# Patient Record
Sex: Male | Born: 1985 | Race: White | Hispanic: No | Marital: Single
Health system: Southern US, Community
[De-identification: ages and names within clinical notes are randomized; demographics above are authoritative.]

## PROBLEM LIST (undated history)

## (undated) DIAGNOSIS — M549 Dorsalgia, unspecified: Secondary | ICD-10-CM

---
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
## 2006-07-25 ENCOUNTER — Ambulatory Visit: Payer: Self-pay | Admitting: Physician Assistant

## 2017-05-10 ENCOUNTER — Encounter (HOSPITAL_COMMUNITY): Payer: Self-pay | Admitting: Emergency Medicine

## 2017-05-10 ENCOUNTER — Emergency Department (HOSPITAL_COMMUNITY): Payer: BLUE CROSS/BLUE SHIELD

## 2017-05-10 ENCOUNTER — Inpatient Hospital Stay (HOSPITAL_COMMUNITY)
Admission: EM | Admit: 2017-05-10 | Discharge: 2017-05-12 | DRG: 918 | Disposition: A | Discharge status: Home / Self Care | Payer: BLUE CROSS/BLUE SHIELD | Attending: Internal Medicine | Admitting: Internal Medicine

## 2017-05-10 DIAGNOSIS — R34 Anuria and oliguria: Secondary | ICD-10-CM | POA: Diagnosis present

## 2017-05-10 DIAGNOSIS — F1910 Other psychoactive substance abuse, uncomplicated: Secondary | ICD-10-CM | POA: Diagnosis present

## 2017-05-10 DIAGNOSIS — T40602A Poisoning by unspecified narcotics, intentional self-harm, initial encounter: Secondary | ICD-10-CM

## 2017-05-10 DIAGNOSIS — T403X1A Poisoning by methadone, accidental (unintentional), initial encounter: Secondary | ICD-10-CM | POA: Diagnosis not present

## 2017-05-10 DIAGNOSIS — G8929 Other chronic pain: Secondary | ICD-10-CM | POA: Diagnosis present

## 2017-05-10 DIAGNOSIS — M545 Low back pain: Secondary | ICD-10-CM | POA: Diagnosis present

## 2017-05-10 DIAGNOSIS — T424X1A Poisoning by benzodiazepines, accidental (unintentional), initial encounter: Secondary | ICD-10-CM | POA: Diagnosis present

## 2017-05-10 DIAGNOSIS — F1114 Opioid abuse with opioid-induced mood disorder: Secondary | ICD-10-CM | POA: Diagnosis present

## 2017-05-10 DIAGNOSIS — J9601 Acute respiratory failure with hypoxia: Secondary | ICD-10-CM

## 2017-05-10 DIAGNOSIS — R410 Disorientation, unspecified: Secondary | ICD-10-CM | POA: Diagnosis not present

## 2017-05-10 HISTORY — DX: Dorsalgia, unspecified: M54.9

## 2017-05-10 LAB — CBC WITH DIFFERENTIAL/PLATELET
Basophils Absolute: 0 10*3/uL (ref 0.0–0.1)
Basophils Relative: 0 %
Eosinophils Absolute: 0.4 10*3/uL (ref 0.0–0.7)
Eosinophils Relative: 3 %
HEMATOCRIT: 38.6 % — AB (ref 39.0–52.0)
Hemoglobin: 13.1 g/dL (ref 13.0–17.0)
LYMPHS ABS: 1.5 10*3/uL (ref 0.7–4.0)
LYMPHS PCT: 11 %
MCH: 29.1 pg (ref 26.0–34.0)
MCHC: 33.9 g/dL (ref 30.0–36.0)
MCV: 85.8 fL (ref 78.0–100.0)
MONO ABS: 0.8 10*3/uL (ref 0.1–1.0)
MONOS PCT: 6 %
NEUTROS ABS: 10.7 10*3/uL — AB (ref 1.7–7.7)
Neutrophils Relative %: 80 %
Platelets: 197 10*3/uL (ref 150–400)
RBC: 4.5 MIL/uL (ref 4.22–5.81)
RDW: 13.3 % (ref 11.5–15.5)
WBC: 13.4 10*3/uL — AB (ref 4.0–10.5)

## 2017-05-10 LAB — COMPREHENSIVE METABOLIC PANEL
ALBUMIN: 3.9 g/dL (ref 3.5–5.0)
ALK PHOS: 50 U/L (ref 38–126)
ALT: 17 U/L (ref 17–63)
ANION GAP: 7 (ref 5–15)
AST: 23 U/L (ref 15–41)
BILIRUBIN TOTAL: 0.6 mg/dL (ref 0.3–1.2)
BUN: 5 mg/dL — ABNORMAL LOW (ref 6–20)
CO2: 27 mmol/L (ref 22–32)
Calcium: 8.2 mg/dL — ABNORMAL LOW (ref 8.9–10.3)
Chloride: 105 mmol/L (ref 101–111)
Creatinine, Ser: 1.05 mg/dL (ref 0.61–1.24)
GFR calc Af Amer: >60 mL/min (ref >60)
GFR calc non Af Amer: >60 mL/min (ref >60)
Glucose, Bld: 98 mg/dL (ref 65–99)
Potassium: 3.7 mmol/L (ref 3.5–5.1)
SODIUM: 139 mmol/L (ref 135–145)
Total Protein: 6.1 g/dL — ABNORMAL LOW (ref 6.5–8.1)

## 2017-05-10 LAB — SALICYLATE LEVEL: Salicylate Lvl: <7 mg/dL (ref 2.8–30.0)

## 2017-05-10 LAB — I-STAT CHEM 8, ED
BUN: 7 mg/dL (ref 6–20)
CALCIUM ION: 1.12 mmol/L — AB (ref 1.15–1.40)
CHLORIDE: 99 mmol/L — AB (ref 101–111)
Creatinine, Ser: 1 mg/dL (ref 0.61–1.24)
Glucose, Bld: 96 mg/dL (ref 65–99)
HEMATOCRIT: 38 % — AB (ref 39.0–52.0)
Hemoglobin: 12.9 g/dL — ABNORMAL LOW (ref 13.0–17.0)
POTASSIUM: 3.4 mmol/L — AB (ref 3.5–5.1)
SODIUM: 139 mmol/L (ref 135–145)
TCO2: 29 mmol/L (ref 0–100)

## 2017-05-10 LAB — ETHANOL: ALCOHOL ETHYL (B): 14 mg/dL — AB (ref <5)

## 2017-05-10 LAB — ACETAMINOPHEN LEVEL: Acetaminophen (Tylenol), Serum: <10 ug/mL — ABNORMAL LOW (ref 10–30)

## 2017-05-10 MED ORDER — SODIUM CHLORIDE 0.9 % IV BOLUS (SEPSIS)
1000.0000 mL | Freq: Once | INTRAVENOUS | Status: AC
  Administered 2017-05-10: 1000 mL via INTRAVENOUS

## 2017-05-10 MED ORDER — NALOXONE HCL 2 MG/2ML IJ SOSY
0.5000 mg/h | PREFILLED_SYRINGE | INTRAVENOUS | Status: DC
  Administered 2017-05-11 (×2): 0.5 mg/h via INTRAVENOUS
  Filled 2017-05-10: qty 4

## 2017-05-10 MED ORDER — NALOXONE HCL 2 MG/2ML IJ SOSY
0.2500 mg/h | PREFILLED_SYRINGE | INTRAVENOUS | Status: DC
  Filled 2017-05-10: qty 4

## 2017-05-10 MED ORDER — NALOXONE HCL 0.4 MG/ML IJ SOLN
0.4000 mg | Freq: Once | INTRAMUSCULAR | Status: AC
  Administered 2017-05-10: 0.4 mg via INTRAVENOUS
  Filled 2017-05-10: qty 1

## 2017-05-10 NOTE — ED Triage Notes (Signed)
PT BIB [facility] for possible over dose. Per EMS 15-20 minutes of Bystander CPR. PT was ambulatory and confused on EMS arrival. Pt has been given 0.4 narcan and 800cc NS. Pt took herion possibly

## 2017-05-10 NOTE — ED Notes (Signed)
Pt states he took heroin today.  Hasn't used in a few days. Uses meth every day.  Pt only complaint at this time is of soreness in his chest and back.

## 2017-05-10 NOTE — ED Notes (Signed)
Called to follow up on Narcan drip

## 2017-05-10 NOTE — ED Provider Notes (Signed)
[facility]ENCY DEPT Provider Note   CSN: [id number] Arrival date & time: 05/10/17  1947     History   Chief Complaint Chief Complaint  Patient presents with  . Drug Overdose    HPI Denvil Canning is a 31 y.o. male.   Drug Overdose  This is a new problem. The current episode started 1 to 2 hours ago. The problem occurs constantly. The problem has not changed since onset.Associated symptoms include chest pain. Nothing aggravates the symptoms. Nothing relieves the symptoms. He has tried nothing for the symptoms.    Past Medical History:  Diagnosis Date  . Back pain     There are no active problems to display for this patient.   History reviewed. No pertinent surgical history.     Home Medications    Prior to Admission medications   Medication Sig Start Date End Date Taking? Authorizing Provider  methadone (DOLOPHINE) 10 MG/ML solution Take 100 mg by mouth daily.   Yes [provider]  Pseudoephedrine-Ibuprofen (ADVIL COLD/SINUS) 30-200 MG TABS Take 1-2 tablets by mouth daily as needed (cold).   Yes [provider]    Family History No family history on file.  Social History Social History  Substance Use Topics  . Smoking status: Not on file  . Smokeless tobacco: Not on file  . Alcohol use Not on file     Allergies   Patient has no known allergies.   Review of Systems Review of Systems  Cardiovascular: Positive for chest pain.  Musculoskeletal: Positive for back pain.  All other systems reviewed and are negative.    Physical Exam Updated Vital Signs BP 110/60   Pulse 96   Temp 97.8 F (36.6 C) (Oral)   Resp 17   SpO2 99%   Physical Exam  Constitutional: He appears well-developed and well-nourished.  HENT:  Head: Normocephalic and atraumatic.  Eyes: Conjunctivae and EOM are normal.  Neck: Normal range of motion.  Cardiovascular: Tachycardia present.   Pulmonary/Chest: Effort normal. Bradypnea noted. No respiratory distress.  He has no wheezes. He has no rales.  Abdominal: He exhibits no distension.  Musculoskeletal: Normal range of motion.  Neurological: He is alert.  Skin: No rash noted. There is erythema.  Nursing note and vitals reviewed.    ED Treatments / Results  Labs (all labs ordered are listed, but only abnormal results are displayed) Labs Reviewed  COMPREHENSIVE METABOLIC PANEL - Abnormal; Notable for the following:       Result Value   BUN 5 (*)    Calcium 8.2 (*)    Total Protein 6.1 (*)    All other components within normal limits  ETHANOL - Abnormal; Notable for the following:    Alcohol, Ethyl (B) 14 (*)    All other components within normal limits  CBC WITH DIFFERENTIAL/PLATELET - Abnormal; Notable for the following:    WBC 13.4 (*)    HCT 38.6 (*)    Neutro Abs 10.7 (*)    All other components within normal limits  ACETAMINOPHEN LEVEL - Abnormal; Notable for the following:    Acetaminophen (Tylenol), Serum <10 (*)    All other components within normal limits  I-STAT CHEM 8, ED - Abnormal; Notable for the following:    Potassium 3.4 (*)    Chloride 99 (*)    Calcium, Ion 1.12 (*)    Hemoglobin 12.9 (*)    HCT 38.0 (*)    All other components within normal limits  SALICYLATE LEVEL  RAPID  URINE DRUG SCREEN, HOSP PERFORMED    EKG  EKG Interpretation  Date/Time:  Saturday May 10 2017 20:02:44 EDT Ventricular Rate:  104 PR Interval:    QRS Duration: 92 QT Interval:  353 QTC Calculation: 465 R Axis:   94 Text Interpretation:  Sinus tachycardia Consider left atrial enlargement Borderline right axis deviation Borderline T wave abnormalities Confirmed by Marily Memos [phone]) on 05/10/2017 8:41:28 PM       Radiology Dg Chest 2 View  Result Date: 05/10/2017 CLINICAL DATA:  Recent heroin use with chest pain EXAM: CHEST  2 VIEW COMPARISON:  None. FINDINGS: Cardiac shadow is within normal limits. The lungs are well aerated bilaterally. No focal infiltrate or sizable  effusion is seen. No acute bony abnormality is noted. IMPRESSION: No acute abnormality seen. Electronically Signed   By: Alcide Clever M.D.   On: 05/10/2017 20:39    Procedures Procedures (including critical care time)  CRITICAL CARE Performed by: Marily Memos Total critical care time: 35 minutes Critical care time was exclusive of separately billable procedures and treating other patients. Critical care was necessary to treat or prevent imminent or life-threatening deterioration. Critical care was time spent personally by me on the following activities: development of treatment plan with patient and/or surrogate as well as nursing, discussions with consultants, evaluation of patient's response to treatment, examination of patient, obtaining history from patient or surrogate, ordering and performing treatments and interventions, ordering and review of laboratory studies, ordering and review of radiographic studies, pulse oximetry and re-evaluation of patient's condition.   Medications Ordered in ED Medications  naloxone (NARCAN) 4 mg in dextrose 5 % 250 mL infusion (not administered)  sodium chloride 0.9 % bolus 1,000 mL (0 mLs Intravenous Stopped 05/10/17 2130)  naloxone [facility]) injection 0.4 mg (0.4 mg Intravenous Given 05/10/17 2211)     Initial Impression / Assessment and Plan / ED Course  I have reviewed the triage vital signs and the nursing notes.  Pertinent labs & imaging results that were available during my care of the patient were reviewed by me and considered in my medical decision making (see chart for details).     Patient on methadone chronically and got a dose this morning. He apparently told his dad that he was in a bite enough heroin to kill himself and subsequently overdosed. Received bystander CPR. On EMS arrival patient had low oxygen and was minimally responsive. Narcan given in route. On further history taken sounds like the wife also gave Narcan at home. On arrival  here the patient is able to converse initially but then subsequently had reduced mental status and his respiratory rate slowed down significantly as well. Another dose of Narcan was given making a total of 3 doses. This had good effect and the patient woke up and appear to be in acute withdrawal for approximately 25-30 minutes and then patient subsequently became sleepy and very bradypneic with co2 retention again. We'll start on a Narcan infusion and admit to ICU. Patient IVC'ed until TTS consult.   Discussed with Dr. Delton Coombes with CCM and will evaluate for admission.   Final Clinical Impressions(s) / ED Diagnoses   Final diagnoses:  Intentional opiate overdose, initial encounter [facility])  Polysubstance abuse     Jenya Putz, Barbara Cower, MD 05/10/17 [phone]

## 2017-05-11 DIAGNOSIS — M545 Low back pain: Secondary | ICD-10-CM | POA: Diagnosis present

## 2017-05-11 DIAGNOSIS — R34 Anuria and oliguria: Secondary | ICD-10-CM | POA: Diagnosis present

## 2017-05-11 DIAGNOSIS — T424X1A Poisoning by benzodiazepines, accidental (unintentional), initial encounter: Secondary | ICD-10-CM | POA: Diagnosis present

## 2017-05-11 DIAGNOSIS — G894 Chronic pain syndrome: Secondary | ICD-10-CM | POA: Diagnosis not present

## 2017-05-11 DIAGNOSIS — F1910 Other psychoactive substance abuse, uncomplicated: Secondary | ICD-10-CM | POA: Diagnosis not present

## 2017-05-11 DIAGNOSIS — R410 Disorientation, unspecified: Secondary | ICD-10-CM | POA: Diagnosis present

## 2017-05-11 DIAGNOSIS — G8929 Other chronic pain: Secondary | ICD-10-CM | POA: Diagnosis present

## 2017-05-11 DIAGNOSIS — F1114 Opioid abuse with opioid-induced mood disorder: Secondary | ICD-10-CM | POA: Diagnosis present

## 2017-05-11 DIAGNOSIS — G9340 Encephalopathy, unspecified: Secondary | ICD-10-CM | POA: Diagnosis not present

## 2017-05-11 DIAGNOSIS — J9601 Acute respiratory failure with hypoxia: Secondary | ICD-10-CM | POA: Diagnosis not present

## 2017-05-11 DIAGNOSIS — T403X1A Poisoning by methadone, accidental (unintentional), initial encounter: Secondary | ICD-10-CM | POA: Diagnosis present

## 2017-05-11 DIAGNOSIS — R4182 Altered mental status, unspecified: Secondary | ICD-10-CM | POA: Diagnosis not present

## 2017-05-11 DIAGNOSIS — T40604S Poisoning by unspecified narcotics, undetermined, sequela: Secondary | ICD-10-CM | POA: Diagnosis not present

## 2017-05-11 LAB — RAPID URINE DRUG SCREEN, HOSP PERFORMED
AMPHETAMINES: NOT DETECTED
BENZODIAZEPINES: POSITIVE — AB
Barbiturates: NOT DETECTED
Cocaine: NOT DETECTED
OPIATES: POSITIVE — AB
Tetrahydrocannabinol: NOT DETECTED

## 2017-05-11 LAB — PHOSPHORUS: Phosphorus: 2.8 mg/dL (ref 2.5–4.6)

## 2017-05-11 LAB — BASIC METABOLIC PANEL
Anion gap: 8 (ref 5–15)
CHLORIDE: 106 mmol/L (ref 101–111)
CO2: 25 mmol/L (ref 22–32)
CREATININE: 0.97 mg/dL (ref 0.61–1.24)
Calcium: 8.5 mg/dL — ABNORMAL LOW (ref 8.9–10.3)
GFR calc Af Amer: >60 mL/min (ref >60)
GFR calc non Af Amer: >60 mL/min (ref >60)
Glucose, Bld: 140 mg/dL — ABNORMAL HIGH (ref 65–99)
Potassium: 3.6 mmol/L (ref 3.5–5.1)
SODIUM: 139 mmol/L (ref 135–145)

## 2017-05-11 LAB — GLUCOSE, CAPILLARY: Glucose-Capillary: 92 mg/dL (ref 65–99)

## 2017-05-11 LAB — MRSA PCR SCREENING: MRSA BY PCR: POSITIVE — AB

## 2017-05-11 LAB — POCT I-STAT 3, ART BLOOD GAS (G3+)
Bicarbonate: 26.1 mmol/L (ref 20.0–28.0)
O2 Saturation: 94 %
PCO2 ART: 46.2 mmHg (ref 32.0–48.0)
PH ART: 7.36 (ref 7.350–7.450)
TCO2: 27 mmol/L (ref 0–100)
pO2, Arterial: 75 mmHg — ABNORMAL LOW (ref 83.0–108.0)

## 2017-05-11 LAB — CBC
HCT: 39.5 % (ref 39.0–52.0)
HEMOGLOBIN: 13.3 g/dL (ref 13.0–17.0)
MCH: 28.9 pg (ref 26.0–34.0)
MCHC: 33.7 g/dL (ref 30.0–36.0)
MCV: 85.9 fL (ref 78.0–100.0)
Platelets: 186 10*3/uL (ref 150–400)
RBC: 4.6 MIL/uL (ref 4.22–5.81)
RDW: 13.4 % (ref 11.5–15.5)
WBC: 9.1 10*3/uL (ref 4.0–10.5)

## 2017-05-11 LAB — MAGNESIUM: MAGNESIUM: 1.7 mg/dL (ref 1.7–2.4)

## 2017-05-11 LAB — HIV ANTIBODY (ROUTINE TESTING W REFLEX): HIV Screen 4th Generation wRfx: NONREACTIVE

## 2017-05-11 MED ORDER — HEPARIN SODIUM (PORCINE) 5000 UNIT/ML IJ SOLN
5000.0000 [IU] | Freq: Three times a day (TID) | INTRAMUSCULAR | Status: DC
  Administered 2017-05-11 – 2017-05-12 (×4): 5000 [IU] via SUBCUTANEOUS
  Filled 2017-05-11 (×5): qty 1

## 2017-05-11 MED ORDER — MUPIROCIN 2 % EX OINT
TOPICAL_OINTMENT | Freq: Two times a day (BID) | CUTANEOUS | Status: DC
  Administered 2017-05-11 – 2017-05-12 (×2): via NASAL
  Filled 2017-05-11: qty 22

## 2017-05-11 MED ORDER — SODIUM CHLORIDE 0.9 % IV SOLN: 250.0000 mL | INTRAVENOUS | Status: DC | PRN

## 2017-05-11 NOTE — ED Notes (Signed)
Safety zone submitted.  EMT and sitter went to change patient when patient noted to be reaching in to his pocket, pulled out multiple pills, one pill made it to patient's mouth before RN could collect other pills.  Pill noted to probably be xanax in correlation with the other pills found on patient.  Security called, patient combative at first, then agreed to follow this RNs commands.  Patient changed, all belongings removed.

## 2017-05-11 NOTE — ED Notes (Signed)
Unable to draw labs at this time.  Pt not cooperating,  Security enroute.

## 2017-05-11 NOTE — Consult Note (Signed)
Reason for ICU admission: Drugs overdose and possible suicidal attempt  In summary: 31 yo M with chronic back pain on methadone and Hx of drug abuse presented to the ED because of confusion due to drug overdose, responded temporary to narcan. Now on narcan drip  On assessment: HR 94, BP 104/55, RR 12, O2 sat 98% on minimal O2 requirement Not in distress, sleepy but arousal S1S2 regular, no murmur Lungs clear Abdomen soft, not tender Warm and well perfused   I reviewed lab work and imaging  Patient is at risk for decompensation and will be admitted to the ICU 1.Drugs overdose (opiates and xanax) 2. Possible suicide  Plan: - admit to the ICU, continue narcan drip - monitor resp status, ETCO2 - IVF, keep NPO - Suicide precautions, urine tox is pending  - send [facility] - DVT prophylaxis  Wife update bedside  Oswaldo Milian, MD CCM attending

## 2017-05-11 NOTE — ED Notes (Addendum)
Spoke to Dr. Melynda Ripple, made aware of patient's combativeness.  Pt has pulled all cardiac leads, SpO2 and BP off multiple times.  Pt rolling in bed, pulling on IV.  IV secured, but unable to keep consistent monitoring on patient.  Dr. Melynda Ripple instructed this RN to slowly titrate down the Narcan drip for better control of patient's withdrawal status as well as combativeness.  Verbal order initiated.    Pills from patient's pocket discarded in to sharps container, witnessed by Marylene Land, ED RN.

## 2017-05-12 DIAGNOSIS — R410 Disorientation, unspecified: Secondary | ICD-10-CM

## 2017-05-12 DIAGNOSIS — R4182 Altered mental status, unspecified: Secondary | ICD-10-CM

## 2017-05-12 DIAGNOSIS — G9340 Encephalopathy, unspecified: Secondary | ICD-10-CM

## 2017-05-12 DIAGNOSIS — F1114 Opioid abuse with opioid-induced mood disorder: Secondary | ICD-10-CM

## 2017-05-12 DIAGNOSIS — F1910 Other psychoactive substance abuse, uncomplicated: Secondary | ICD-10-CM

## 2017-05-12 DIAGNOSIS — J9601 Acute respiratory failure with hypoxia: Secondary | ICD-10-CM

## 2017-05-12 DIAGNOSIS — T40604S Poisoning by unspecified narcotics, undetermined, sequela: Secondary | ICD-10-CM

## 2017-05-12 DIAGNOSIS — G894 Chronic pain syndrome: Secondary | ICD-10-CM

## 2017-05-12 MED ORDER — ACETAMINOPHEN 325 MG PO TABS
650.0000 mg | ORAL_TABLET | Freq: Four times a day (QID) | ORAL | Status: DC | PRN
  Administered 2017-05-12: 650 mg via ORAL
  Filled 2017-05-12: qty 2

## 2017-05-12 MED ORDER — METHADONE HCL 10 MG PO TABS
10.0000 mg | ORAL_TABLET | Freq: Two times a day (BID) | ORAL | Status: DC
  Administered 2017-05-12: 10 mg via ORAL
  Filled 2017-05-12: qty 1

## 2017-05-12 NOTE — Progress Notes (Signed)
eLink Physician-Brief Progress Note Patient Name: Hall Ashbeck DOB: 14-Jul-1986 MRN: [id number]   Date of Service  05/12/2017  HPI/Events of Note  Multiple issues: 1. Oliguria - bladder scan with 579 mL residual and 2. Patient c/o headache - AST and ALT are both normal.   eICU Interventions  Will order: 1. I/O Cath PRN. 2. Tylenol 650 mg PO Q 6 hours PRN.      Intervention Category Intermediate Interventions: Oliguria - evaluation and management;Pain - evaluation and management  Deshonda Cryderman [place] 05/12/2017, 6:26 AM

## 2017-05-12 NOTE — Discharge Summary (Signed)
Physician Discharge Summary  Patient ID: Robyn Nohr MRN: [id number] DOB/AGE: 21-Sep-1986 31 y.o.  Admit date: 05/10/2017 Discharge date: 05/12/2017    Discharge Diagnoses:  Methadone Overdose Heroin Abuse  Chronic Back Pain                                                                      DISCHARGE PLAN BY DIAGNOSIS     Methadone Overdose Heroin Abuse  Chronic Back Pain  Discharge Plan: Res[facility] at [facility]  No scripts for Methadone at time of discharge.  Avoid other narcotic / sedating medications Avoid heroin use / patient counseled on drug abuse at time of discharge Consider gradual reduction of methadone                    DISCHARGE SUMMARY    31 y/o M with chronic pain on observed therapy for methadone who presented 8/18 with opiate overdose.  The patient reportedly is on 100 mg methadone per day during observed therapy.  He reportedly told his father prior to admission that he was going to buy enough heroin to kill himself and subsequently overdosed.  He received bystander CPR.  On EMS arrival he was minimally responsive with low oxygen saturations.  He was given Narcan at home per his wife and via EMS.  On arrival to ER he was initially responsive but became more somnolent with decreased respiratory rate.  He was treated with further Narcan and acutely appeared to be in withdrawal.  Later, he became sleepy again and had CO2 retention.  He was started on a Narcan infusion and admitted to the ICU.  The patient was involuntarily committed until he could be evaluated by psychiatry.  Suicide sitter was placed at bedside.  Narcan gtt was weaned off 8/19.  He was evaluated by psychiatry on 8/20 and felt not to be a risk to self or others.  He was medically cleared for discharge 8/20 with plans as above.  At discharge, wife informed staff that he has been using heroin in addition to methadone.                 SIGNIFICANT DIAGNOSTIC STUDIES 8/18  Admit with  methadone overdose, narcan gtt 8/19  Narcan gtt turned off 8/20  Psychiatry evaluation, pt denies self harm intent   MICRO DATA  BCx2 8/19 >>   CONSULTS Psychiatry    Discharge Exam: General: thin adult male in NAD HEENT: MM pink/moist Neuro:  Awake, alert, oriented, flat affect  CV: s1s2 rrr, no m/r/g PULM: even/non-labored, lungs bilaterally clear OT:LXBW, non-tender, bsx4 active  Extremities: warm/dry, no edema  Skin: no rashes or lesions  Vitals:   05/12/17 0900 05/12/17 1100 05/12/17 1156 05/12/17 1200  BP: 108/62 109/66  (!) 99/54  Pulse: 88 83  94  Resp: 11 12  15   Temp:   97.8 F (36.6 C)   TempSrc:   Oral   SpO2: 96% 96%  97%  Weight:      Height:         Discharge Labs  BMET  Recent Labs Lab 05/10/17 2053 05/10/17 2209 05/11/17 0227  NA 139 139 139  K 3.4* 3.7 3.6  CL 99* 105 106  CO2  --  27 25  GLUCOSE 96 98 140*  BUN 7 5* <5*  CREATININE 1.00 1.05 0.97  CALCIUM  --  8.2* 8.5*  MG  --   --  1.7  PHOS  --   --  2.8    CBC  Recent Labs Lab 05/10/17 2053 05/10/17 2209 05/11/17 0227  HGB 12.9* 13.1 13.3  HCT 38.0* 38.6* 39.5  WBC  --  13.4* 9.1  PLT  --  197 186     Discharge Instructions    Call MD for:  difficulty breathing, headache or visual disturbances    Complete by:  As directed    Call MD for:  extreme fatigue    Complete by:  As directed    Call MD for:  hives    Complete by:  As directed    Call MD for:  persistant dizziness or light-headedness    Complete by:  As directed    Call MD for:  persistant nausea and vomiting    Complete by:  As directed    Call MD for:  severe uncontrolled pain    Complete by:  As directed    Call MD for:  temperature >100.4    Complete by:  As directed    Diet - low sodium heart healthy    Complete by:  As directed    Discharge instructions    Complete by:  As directed    1.  You need to be seen at your [facility] Clinic before taking your next dose.   2.  We recommend your  Methadone be significantly reduced to avoid overdose / oversedation.  3.  Review your medications carefully.  4.  Return to the ER immediately if you have new or worsening concerns.   Increase activity slowly    Complete by:  As directed        Follow-up Information    [place] Follow up.   Why:  [phone] - Call for an appointment to be seen.    Contact information: [place] [address] [phone]            Allergies as of 05/12/2017   No Known Allergies     Medication List    STOP taking these medications   ADVIL COLD/SINUS 30-200 MG Tabs Generic drug:  Pseudoephedrine-Ibuprofen   methadone 10 MG/ML solution Commonly known as:  DOLOPHINE         Disposition:  Home, no new home health needs identified.   Discharged Condition: Domenik Trice has met maximum benefit of inpatient care and is medically stable and cleared for discharge.  Patient is pending follow up as above.      Time spent on disposition:  35 Minutes.   Signed: Noe Gens, NP-C [facility] Pgr: [phone] Office: [phone]    Discharge Summary [facility]axed to [facility]   Attending Note:  31 year old with chronic back pain that uses 100 mg of methadone a day that presented with per patient an accidental overdose.  Patient has been off narcan drip since 8/19 at 1 PM and has been doing well.  Patient is asking to go home.  Psych evaluated the patient and does not believe patient is a suicide risk.  On physical exam, patient is stable with clear lungs.  Awake and interactive.  I spoke with patient and he reports that he was not trying to hurt himself.  He got his methadone in the morning and  used heroine.  Patient is ready for discharge.  Discussed with patient, bedside RN and PCCM-NP.  Psych to rescind IVC.  Patient seen and examined, agree with above note.  I dictated the care and orders written for this patient under my  direction.  Rush Farmer, [place]

## 2017-05-12 NOTE — Progress Notes (Signed)
[place] P[facility]M   Reason for ICU admission: Drugs overdose and possible suicidal attempt  In summary: 30 yo M with chronic back pain on methadone and Hx of drug abuse presented to the ED because of confusion due to drug overdose, responded temporary to narcan. Placed on narcan gtt.  Now off narcan since yesterday afternoon.   Subjective/overnight:  Off narcan. No c/o.  Wants to go home.  Denies OD.   On assessment: Vitals:   05/12/17 0600 05/12/17 0700 05/12/17 0800 05/12/17 0900  BP: (!) 99/57 102/64 100/67 108/62  Pulse: 84 82 81 88  Resp: 14 19 12 11   Temp:   99 F (37.2 C)   TempSrc:   Oral   SpO2: 97% 96% 97% 96%  Weight:      Height:       Physical exam:  General:  Thin young male, NAD  HEENT: MM pink/moist PSY: flat affect, denies OD Neuro: awake, alert CV: s1s2 rrr, no m/r/g PULM: even/non-labored, lungs bilaterally clear TD:HRCB, non-tender, bsx4 active  Extremities: warm/dry, no edema  Skin: no rashes or lesions     Recent Labs Lab 05/10/17 2053 05/10/17 2209 05/11/17 0227  HGB 12.9* 13.1 13.3  HCT 38.0* 38.6* 39.5  WBC  --  13.4* 9.1  PLT  --  197 186    Recent Labs Lab 05/10/17 2053 05/10/17 2209 05/11/17 0227  NA 139 139 139  K 3.4* 3.7 3.6  CL 99* 105 106  CO2  --  27 25  GLUCOSE 96 98 140*  BUN 7 5* <5*  CREATININE 1.00 1.05 0.97  CALCIUM  --  8.2* 8.5*  MG  --   --  1.7  PHOS  --   --  2.8    Imp/Plan:  1.Drugs overdose (opiates and xanax) 2. Possible suicide  Plan: Monitor off narcan gtt  tx out ICU  Await psych consult  Add diet  Suicide precautions  DVT proph  Resume methadone slowly - avoid wd  Will ask [facility] to assume care 8/21.   Wife update bedside 8/20   Dirk Dress, NP 05/12/2017  10:06 AM Pager: [phone] or [phone]  Attending Note:  31 year old male with history of narcotic abuse on methadone 100 mg daily who presents with narcotic overdose requiring narcan drip.  Patient was admitted to  the ICU for narcan drip and monitoring for airway protection and respiratory status.  Patient is now awake and asking for narcotics for pain that is generalized.  Lungs are clear to auscultation.  Discussed with PCCM-NP and TR[facility]-MD.    Narcotic overdose:  - D/C narcan  - Hold off further narcotics at this point  - Psych consult  Suicide?  - Can not leave AMA until cleared by psych  - Suicide precautsions  Chronic pain:  - Methadone 10 mg PO BID and hold for sedation  Disposition:  - Transfer to SDU and to [facility] service with PCCM off 8/21  Patient seen and examined, agree with above note.  I dictated the care and orders written for this patient under my direction.  Alyson Reedy, MD [phone]

## 2017-05-12 NOTE — Consult Note (Signed)
[facility] Consult   Reason for Consult:  Drug overdose and questionable suicide intent Referring Physician:  Dr. Titus Mould Patient Identification: Jeffrey Oliver MRN:  [id number] Principal Diagnosis: Opioid abuse with opioid-induced mood disorder [facility]) Diagnosis:   Patient Active Problem List   Diagnosis Date Noted  . Drug abuse [F19.10] 05/11/2017    Total Time spent with patient: 1 hour  Subjective:   Jeffrey Oliver is a 31 y.o. male patient admitted with confusion and AMS.  HPI:  [name] is a 31 yo M with chronic back pain on methadone and Hx of opioid and benzo abuse presented to the [facility] medical center with confusion due to unintentional drug overdose. Patient seen, chart reviewed and case discussed with his wife who is at bed side and staff RN. Patient stated that he has been sober until this weekend. He suppose to start his new job on Monday, so he and his friend decided to got [address], [place] to get high and have fun. Patient stated that he thought he is snorting heroin but it is not pure and seems to be mixed with fantanyl which caused him excessive sedation and confusion. He denied intentional drug overdose and suicide attempt. He has been awake, alert, oriented x 3 at this time. He has plans of going home and start work as soon as he will discharged and willing to go to methadone clinic as scheduled for ongoing maintenance treatment.    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 He has no [facility] admission.   Risk to Self: Is patient at risk for suicide?: No Risk to Others:   Prior Inpatient Therapy:   Prior Outpatient Therapy:    Past Medical History:  Past Medical History:  Diagnosis Date  . Back pain    History reviewed. No pertinent surgical history. Family History: No family history on file. Family Psychiatric  History: Significant substance abuse in multiple family member's including his spouse.  Social History:  History   Alcohol use Not on file     History  Drug use: Unknown    Social History   Social History  . Marital status: Single    Spouse name: N/A  . Number of children: N/A  . Years of education: N/A   Social History Main Topics  . Smoking status: None  . Smokeless tobacco: None  . Alcohol use None  . Drug use: Unknown  . Sexual activity: Not Asked   Other Topics Concern  . None   Social History Narrative  . None   Additional Social History: patient has supportive family.     Allergies:  No Known Allergies  Labs:  Results for orders placed or performed during the hospital encounter of 05/10/17 (from the past 48 hour(s))  I-Stat Chem 8, ED     Status: Abnormal   Collection Time: 05/10/17  8:53 PM  Result Value Ref Range   Sodium 139 135 - 145 mmol/L   Potassium 3.4 (L) 3.5 - 5.1 mmol/L   Chloride 99 (L) 101 - 111 mmol/L   BUN 7 6 - 20 mg/dL   Creatinine, Ser 1.00 0.61 - 1.24 mg/dL   Glucose, Bld 96 65 - 99 mg/dL   Calcium, Ion 1.12 (L) 1.15 - 1.40 mmol/L   TCO2 29 0 - 100 mmol/L   Hemoglobin 12.9 (L) 13.0 - 17.0 g/dL   HCT 38.0 (L) 39.0 - 52.0 %  Ethanol     Status: Abnormal   Collection Time:  05/10/17  9:59 PM  Result Value Ref Range   Alcohol, Ethyl (B) 14 (H) <5 mg/dL    Comment:        LOWEST DETECTABLE LIMIT FOR SERUM ALCOHOL IS 5 mg/dL FOR MEDICAL PURPOSES ONLY   Acetaminophen level     Status: Abnormal   Collection Time: 05/10/17  9:59 PM  Result Value Ref Range   Acetaminophen (Tylenol), Serum <10 (L) 10 - 30 ug/mL    Comment:        THERAPEUTIC CONCENTRATIONS VARY SIGNIFICANTLY. A RANGE OF 10-30 ug/mL MAY BE AN EFFECTIVE CONCENTRATION FOR MANY PATIENTS. HOWEVER, SOME ARE BEST TREATED AT CONCENTRATIONS OUTSIDE THIS RANGE. ACETAMINOPHEN CONCENTRATIONS >150 ug/mL AT 4 HOURS AFTER INGESTION AND >50 ug/mL AT 12 HOURS AFTER INGESTION ARE OFTEN ASSOCIATED WITH TOXIC REACTIONS.   Salicylate level     Status: None   Collection Time: 05/10/17  9:59 PM   Result Value Ref Range   Salicylate Lvl <6.6 2.8 - 30.0 mg/dL  Comprehensive metabolic panel     Status: Abnormal   Collection Time: 05/10/17 10:09 PM  Result Value Ref Range   Sodium 139 135 - 145 mmol/L   Potassium 3.7 3.5 - 5.1 mmol/L   Chloride 105 101 - 111 mmol/L   CO2 27 22 - 32 mmol/L   Glucose, Bld 98 65 - 99 mg/dL   BUN 5 (L) 6 - 20 mg/dL   Creatinine, Ser 1.05 0.61 - 1.24 mg/dL   Calcium 8.2 (L) 8.9 - 10.3 mg/dL   Total Protein 6.1 (L) 6.5 - 8.1 g/dL   Albumin 3.9 3.5 - 5.0 g/dL   AST 23 15 - 41 U/L   ALT 17 17 - 63 U/L   Alkaline Phosphatase 50 38 - 126 U/L   Total Bilirubin 0.6 0.3 - 1.2 mg/dL   GFR calc non Af Amer >60 >60 mL/min   GFR calc Af Amer >60 >60 mL/min    Comment: (NOTE) The eGFR has been calculated using the CKD EPI equation. This calculation has not been validated in all clinical situations. eGFR's persistently <60 mL/min signify possible Chronic Kidney Disease.    Anion gap 7 5 - 15  CBC with Diff     Status: Abnormal   Collection Time: 05/10/17 10:09 PM  Result Value Ref Range   WBC 13.4 (H) 4.0 - 10.5 K/uL   RBC 4.50 4.22 - 5.81 MIL/uL   Hemoglobin 13.1 13.0 - 17.0 g/dL   HCT 38.6 (L) 39.0 - 52.0 %   MCV 85.8 78.0 - 100.0 fL   MCH 29.1 26.0 - 34.0 pg   MCHC 33.9 30.0 - 36.0 g/dL   RDW 13.3 11.5 - 15.5 %   Platelets 197 150 - 400 K/uL   Neutrophils Relative % 80 %   Neutro Abs 10.7 (H) 1.7 - 7.7 K/uL   Lymphocytes Relative 11 %   Lymphs Abs 1.5 0.7 - 4.0 K/uL   Monocytes Relative 6 %   Monocytes Absolute 0.8 0.1 - 1.0 K/uL   Eosinophils Relative 3 %   Eosinophils Absolute 0.4 0.0 - 0.7 K/uL   Basophils Relative 0 %   Basophils Absolute 0.0 0.0 - 0.1 K/uL  HIV antibody (Routine Testing)     Status: None   Collection Time: 05/11/17  2:27 AM  Result Value Ref Range   HIV Screen 4th Generation wRfx Non Reactive Non Reactive    Comment: (NOTE) Performed At: [facility] [address] [place], [place] [id number] Darrel Hoover  F MD UX:[id number]   CBC     Status: None   Collection Time: 05/11/17  2:27 AM  Result Value Ref Range   WBC 9.1 4.0 - 10.5 K/uL   RBC 4.60 4.22 - 5.81 MIL/uL   Hemoglobin 13.3 13.0 - 17.0 g/dL   HCT 39.5 39.0 - 52.0 %   MCV 85.9 78.0 - 100.0 fL   MCH 28.9 26.0 - 34.0 pg   MCHC 33.7 30.0 - 36.0 g/dL   RDW 13.4 11.5 - 15.5 %   Platelets 186 150 - 400 K/uL  Basic metabolic panel     Status: Abnormal   Collection Time: 05/11/17  2:27 AM  Result Value Ref Range   Sodium 139 135 - 145 mmol/L   Potassium 3.6 3.5 - 5.1 mmol/L   Chloride 106 101 - 111 mmol/L   CO2 25 22 - 32 mmol/L   Glucose, Bld 140 (H) 65 - 99 mg/dL   BUN <5 (L) 6 - 20 mg/dL   Creatinine, Ser 0.97 0.61 - 1.24 mg/dL   Calcium 8.5 (L) 8.9 - 10.3 mg/dL   GFR calc non Af Amer >60 >60 mL/min   GFR calc Af Amer >60 >60 mL/min    Comment: (NOTE) The eGFR has been calculated using the CKD EPI equation. This calculation has not been validated in all clinical situations. eGFR's persistently <60 mL/min signify possible Chronic Kidney Disease.    Anion gap 8 5 - 15  Magnesium     Status: None   Collection Time: 05/11/17  2:27 AM  Result Value Ref Range   Magnesium 1.7 1.7 - 2.4 mg/dL  Phosphorus     Status: None   Collection Time: 05/11/17  2:27 AM  Result Value Ref Range   Phosphorus 2.8 2.5 - 4.6 mg/dL  Culture, blood (routine x 2)     Status: None (Preliminary result)   Collection Time: 05/11/17  2:30 AM  Result Value Ref Range   Specimen Description BLOOD LEFT ARM    Special Requests IN PEDIATRIC BOTTLE Blood Culture adequate volume    Culture NO GROWTH 1 DAY    Report Status PENDING   Culture, blood (routine x 2)     Status: None (Preliminary result)   Collection Time: 05/11/17  2:40 AM  Result Value Ref Range   Specimen Description BLOOD RIGHT HAND    Special Requests IN PEDIATRIC BOTTLE Blood Culture adequate volume    Culture NO GROWTH 1 DAY    Report Status PENDING   Glucose, capillary     Status: None    Collection Time: 05/11/17  4:35 AM  Result Value Ref Range   Glucose-Capillary 92 65 - 99 mg/dL   Comment 1 Capillary Specimen   Urine rapid drug screen (hosp performed)     Status: Abnormal   Collection Time: 05/11/17  4:55 AM  Result Value Ref Range   Opiates POSITIVE (A) NONE DETECTED   Cocaine NONE DETECTED NONE DETECTED   Benzodiazepines POSITIVE (A) NONE DETECTED   Amphetamines NONE DETECTED NONE DETECTED   Tetrahydrocannabinol NONE DETECTED NONE DETECTED   Barbiturates NONE DETECTED NONE DETECTED    Comment:        DRUG SCREEN FOR MEDICAL PURPOSES ONLY.  IF CONFIRMATION IS NEEDED FOR ANY PURPOSE, NOTIFY LAB WITHIN 5 DAYS.        LOWEST DETECTABLE LIMITS FOR URINE DRUG SCREEN Drug Class       Cutoff (ng/mL) Amphetamine      1000 Barbiturate  200 Benzodiazepine   283 Tricyclics       151 Opiates          300 Cocaine          300 THC              50   MRSA PCR Screening     Status: Abnormal   Collection Time: 05/11/17  6:29 AM  Result Value Ref Range   MRSA by PCR POSITIVE (A) NEGATIVE    Comment:        The GeneXpert MRSA Assay (FDA approved for NASAL specimens only), is one component of a comprehensive MRSA colonization surveillance program. It is not intended to diagnose MRSA infection nor to guide or monitor treatment for MRSA infections. RESULT CALLED TO, READ BACK BY AND VERIFIED WITH: C FORCHA,RN AT 1021 05/11/17 BY N MORRIS   I-STAT 3, arterial blood gas (G3+)     Status: Abnormal   Collection Time: 05/11/17  5:13 PM  Result Value Ref Range   pH, Arterial 7.360 7.350 - 7.450   pCO2 arterial 46.2 32.0 - 48.0 mmHg   pO2, Arterial 75.0 (L) 83.0 - 108.0 mmHg   Bicarbonate 26.1 20.0 - 28.0 mmol/L   TCO2 27 0 - 100 mmol/L   O2 Saturation 94.0 %   Patient temperature HIDE    Collection site RADIAL, ALLEN'S TEST ACCEPTABLE    Drawn by RT    Sample type ARTERIAL     Current Facility-Administered Medications  Medication Dose Route Frequency  Provider Last Rate Last Dose  . 0.9 %  sodium chloride infusion  250 mL Intravenous PRN Judeth Porch, MD      . acetaminophen (TYLENOL) tablet 650 mg  650 mg Oral Q6H PRN Anders Simmonds, MD   650 mg at 05/12/17 0641  . heparin injection 5,000 Units  5,000 Units Subcutaneous Q8H Judeth Porch, MD   5,000 Units at 05/12/17 [id number]  . methadone (DOLOPHINE) tablet 10 mg  10 mg Oral BID Rush Farmer, MD   10 mg at 05/12/17 1125  . mupirocin ointment (BACTROBAN) 2 %   Nasal BID Raylene Miyamoto, MD        Musculoskeletal: Strength & Muscle Tone: within normal limits Gait & Station: unable to stand Patient leans: N/A  Psychiatric Specialty Exam: Physical Exam as per history and physical  ROS calm, cooperative, awake, alert, oriented to himself, family members and his recent unintentional overdose while trying to get high. Denied opioid withdrawal side effects. Denied nausea, vomiting, abdominal pain, chest pain and SOB. No Fever-chills, No Headache, No changes with Vision or hearing, reports vertigo No problems swallowing food or Liquids, No Chest pain, Cough or Shortness of Breath, No Abdominal pain, No Nausea or Vommitting, Bowel movements are regular, No Blood in stool or Urine, No dysuria, No new skin rashes or bruises, No new joints pains-aches,  No new weakness, tingling, numbness in any extremity, No recent weight gain or loss, No polyuria, polydypsia or polyphagia,  A full 10 point Review of Systems was done, except as stated above, all other Review of Systems were negative.  Blood pressure 108/62, pulse 88, temperature 97.8 F (36.6 C), temperature source Oral, resp. rate 11, height 6' (1.829 m), weight 60 kg (132 lb 4.4 oz), SpO2 96 %.Body mass index is 17.94 kg/m.  General Appearance: Casual  Eye Contact:  Good  Speech:  Clear and Coherent  Volume:  Decreased  Mood:  Euthymic  Affect:  Appropriate and Congruent  Thought Process:  Coherent and Goal Directed   Orientation:  Full (Time, Place, and Person)  Thought Content:  WDL  Suicidal Thoughts:  No  Homicidal Thoughts:  No  Memory:  Immediate;   Good Recent;   Fair Remote;   Fair  Judgement:  Impaired  Insight:  Fair  Psychomotor Activity:  Decreased  Concentration:  Concentration: Good and Attention Span: Fair  Recall:  Good  Fund of Knowledge:  Good  Language:  Good  Akathisia:  Negative  Handed:  Right  AIMS (if indicated):     Assets:  Communication Skills Desire for Improvement Financial Resources/Insurance Housing Intimacy Leisure Time Physical Health Resilience Social Support Talents/Skills Transportation Vocational/Educational  ADL's:  Intact  Cognition:  WNL  Sleep:        Treatment Plan Summary: Patient presented with confusion and AMS after unintentional overdose of opioids, benzo and alcohol as a relapse after few months prior to starting a new job. He has denied suicide or homicide ideation, intention or plans.  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 Patient is psychiatrically cleared for out patient substance abuse treatment Methadone clinic He has good family support NO psychotropic medication recommended   Disposition: Patient does not meet criteria for psychiatric inpatient admission. Supportive therapy provided about ongoing stressors.  Ambrose Finland, MD 05/12/2017 12:18 PM

## 2017-05-13 DIAGNOSIS — F1114 Opioid abuse with opioid-induced mood disorder: Secondary | ICD-10-CM | POA: Diagnosis present

## 2017-05-16 LAB — CULTURE, BLOOD (ROUTINE X 2)
Culture: NO GROWTH
Culture: NO GROWTH
Special Requests: ADEQUATE
Special Requests: ADEQUATE

## 2018-12-23 IMAGING — DX DG CHEST 2V
2 series · 2 of 2 positions shown · non-contrast
Comparison: None.

CLINICAL DATA: Recent heroin use with chest pain

EXAM:
CHEST  2 VIEW

[chest lat]
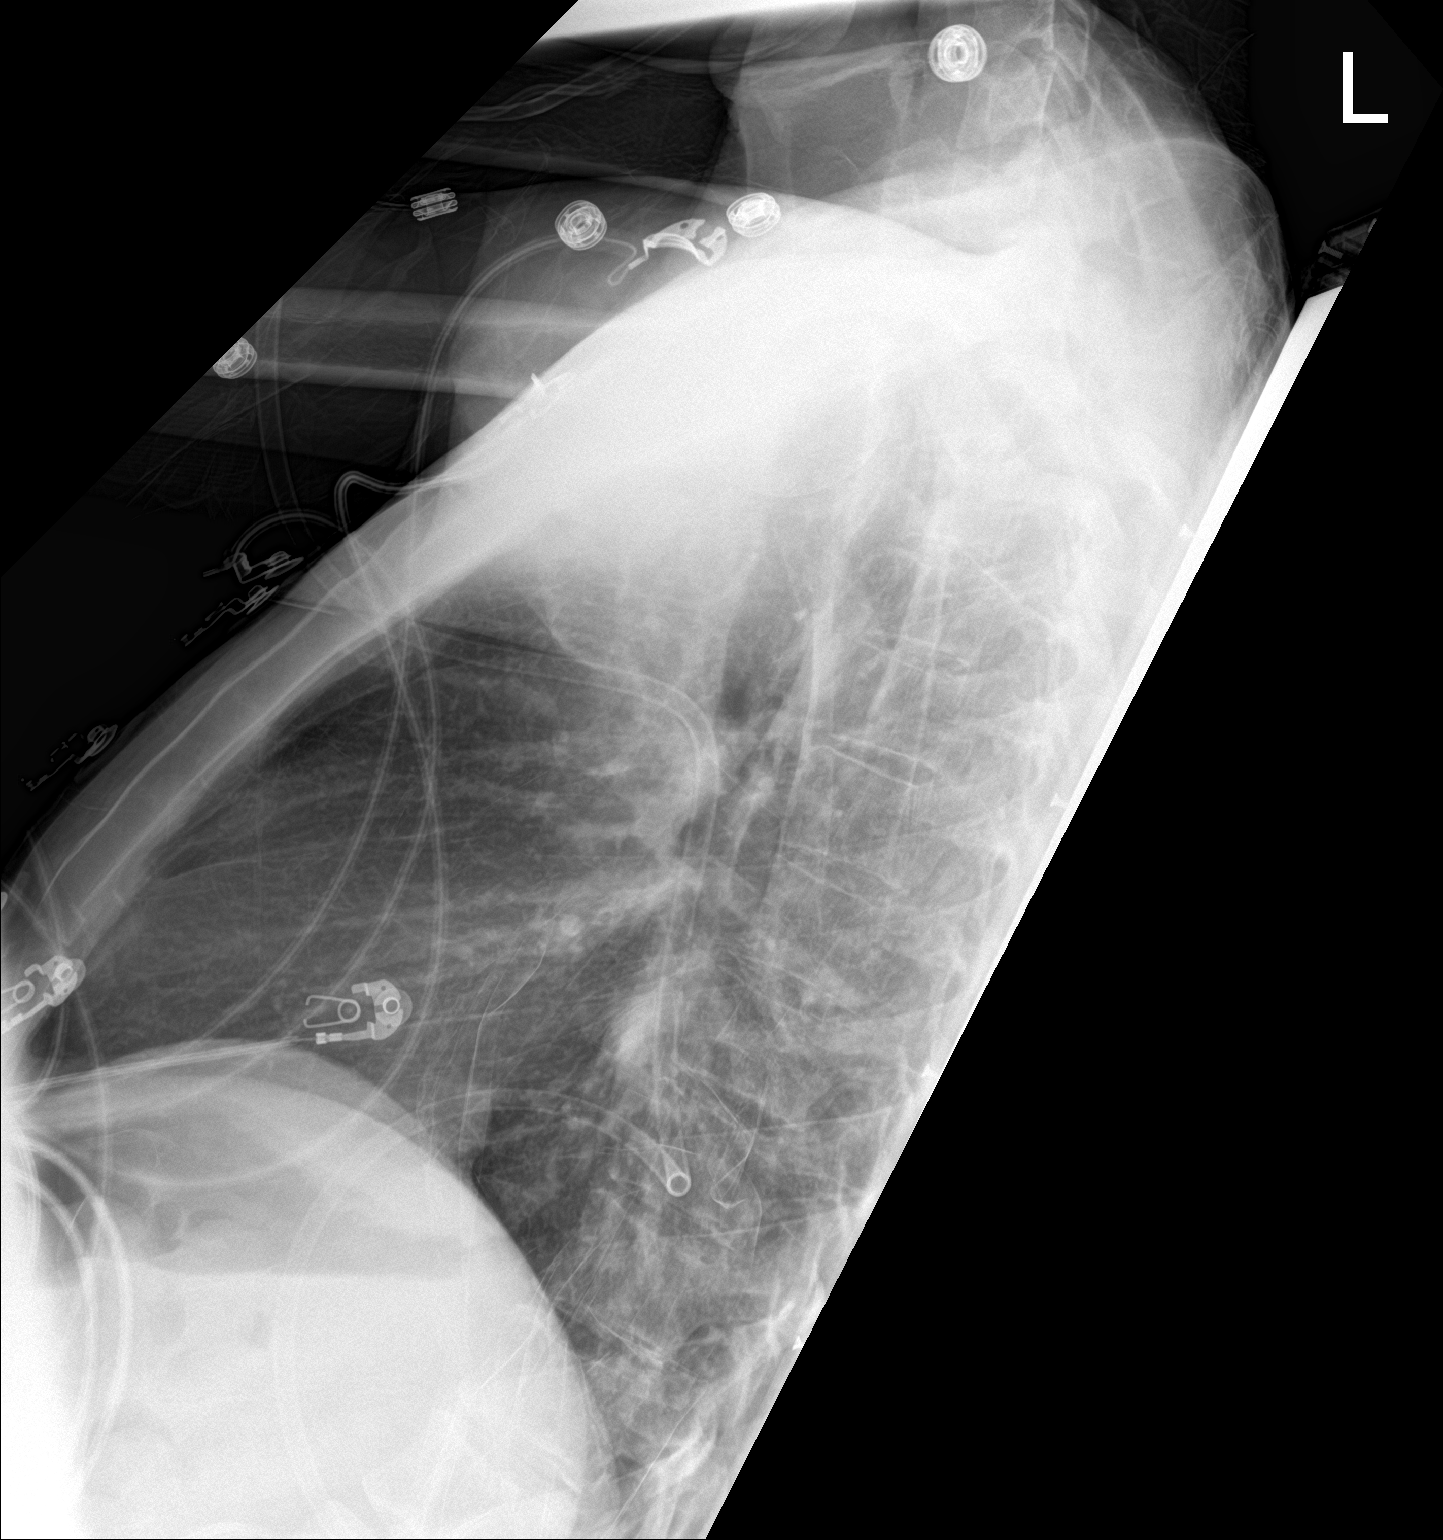

[chest ap]
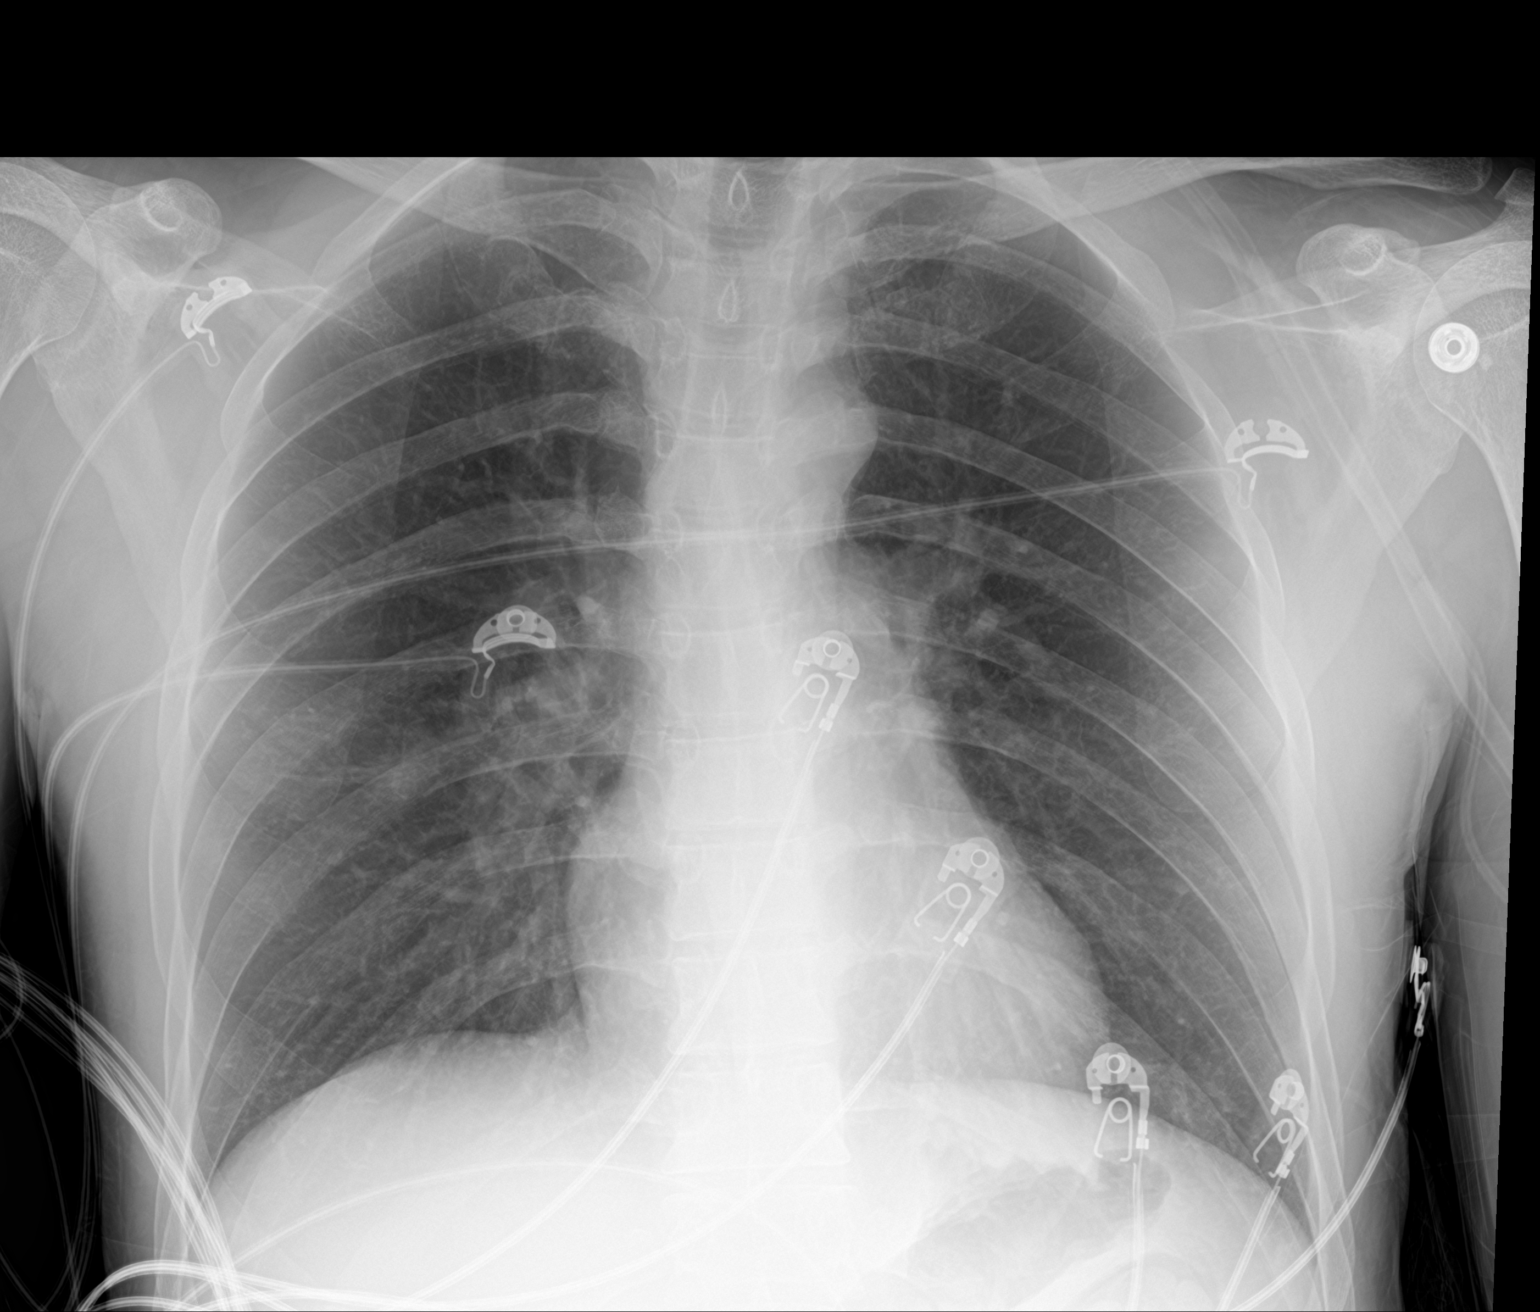

[2 of 2 positions shown; findings below may reference images not displayed]

FINDINGS: Cardiac shadow is within normal limits. The lungs are well aerated
bilaterally. No focal infiltrate or sizable effusion is seen. No
acute bony abnormality is noted.
IMPRESSION: No acute abnormality seen.
